# Patient Record
Sex: Male | Born: 2000 | Race: Black or African American | Hispanic: No | Marital: Single | Smoking: Current every day smoker
Health system: Southern US, Community
[De-identification: ages and names within clinical notes are randomized; demographics above are authoritative.]

## PROBLEM LIST (undated history)

## (undated) DIAGNOSIS — J45909 Unspecified asthma, uncomplicated: Secondary | ICD-10-CM

## (undated) DIAGNOSIS — J302 Other seasonal allergic rhinitis: Secondary | ICD-10-CM

## (undated) DIAGNOSIS — G43909 Migraine, unspecified, not intractable, without status migrainosus: Secondary | ICD-10-CM

## (undated) DIAGNOSIS — A879 Viral meningitis, unspecified: Secondary | ICD-10-CM

## (undated) HISTORY — PX: TONSILLECTOMY: SUR1361

## (undated) HISTORY — PX: CIRCUMCISION: SUR203

---
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
## 2018-04-11 ENCOUNTER — Other Ambulatory Visit: Payer: Self-pay

## 2018-04-11 ENCOUNTER — Encounter (HOSPITAL_BASED_OUTPATIENT_CLINIC_OR_DEPARTMENT_OTHER): Payer: Self-pay | Admitting: *Deleted

## 2018-04-11 ENCOUNTER — Emergency Department (HOSPITAL_BASED_OUTPATIENT_CLINIC_OR_DEPARTMENT_OTHER)
Admission: EM | Admit: 2018-04-11 | Discharge: 2018-04-11 | Disposition: A | Discharge status: Home / Self Care | Payer: Medicaid Other | Attending: Emergency Medicine | Admitting: Emergency Medicine

## 2018-04-11 DIAGNOSIS — F17210 Nicotine dependence, cigarettes, uncomplicated: Secondary | ICD-10-CM | POA: Insufficient documentation

## 2018-04-11 DIAGNOSIS — G43909 Migraine, unspecified, not intractable, without status migrainosus: Secondary | ICD-10-CM | POA: Diagnosis present

## 2018-04-11 DIAGNOSIS — J45909 Unspecified asthma, uncomplicated: Secondary | ICD-10-CM | POA: Insufficient documentation

## 2018-04-11 DIAGNOSIS — Z79899 Other long term (current) drug therapy: Secondary | ICD-10-CM | POA: Diagnosis not present

## 2018-04-11 DIAGNOSIS — G43009 Migraine without aura, not intractable, without status migrainosus: Secondary | ICD-10-CM | POA: Diagnosis not present

## 2018-04-11 MED ORDER — METOCLOPRAMIDE HCL 5 MG/ML IJ SOLN
10.0000 mg | Freq: Once | INTRAMUSCULAR | Status: AC
  Administered 2018-04-11: 10 mg via INTRAVENOUS
  Filled 2018-04-11: qty 2

## 2018-04-11 NOTE — ED Triage Notes (Signed)
Pt reports his usual migraine pain radiating from right temple to left, with photophobia, states he went to work but could not finish out his shift due to the headache. Denies n/v/fevers or any other c/o.

## 2018-04-11 NOTE — Discharge Instructions (Addendum)
Return as needed or follow up   with your primary care physician

## 2018-04-11 NOTE — ED Provider Notes (Signed)
[facility] EMERGENCY DEPARTMENT Provider Note   CSN: [id number] Arrival date & time: 04/11/18  7829     History   Chief Complaint Chief Complaint  Patient presents with  . Migraine    HPI Rick Silva is a 17 y.o. male., complains of typical migraine headache started last night gradually. Headache is throbbing in nature accompanied by nausea and photophobia he took a medication which she does not recall,with transient relief last night. Headache was severe again upon awakening this morning.symptoms made worse by looking at light. Not improved by anything. No other associated symptoms  HPI  Past Medical History:  Diagnosis Date  . Asthma   . Meningitis, viral   . Migraine   . Seasonal allergies     Patient Active Problem List   Diagnosis Date Noted  . Vasovagal syncope 10/22/2014  . Tension headache 10/22/2014  . Anxiety state 10/22/2014    Past Surgical History:  Procedure Laterality Date  . ADENOIDECTOMY, TONSILLECTOMY AND MYRINGOTOMY WITH TUBE PLACEMENT Bilateral 2006  . CIRCUMCISION    . TONSILLECTOMY          Home Medications    Prior to Admission medications   Medication Sig Start Date End Date Taking? Authorizing Provider  albuterol (PROVENTIL HFA;VENTOLIN HFA) 108 (90 BASE) MCG/ACT inhaler Inhale 2 puffs into the lungs every 6 (six) hours as needed for wheezing or shortness of breath.    [provider]  albuterol (PROVENTIL) (2.5 MG/3ML) 0.083% nebulizer solution Take 2.5 mg by nebulization every 6 (six) hours as needed for wheezing or shortness of breath.    [provider]  budesonide (PULMICORT) 0.5 MG/2ML nebulizer solution Take 0.5 mg by nebulization 2 (two) times daily.    [provider]  cetirizine (ZYRTEC) 10 MG tablet Take 10 mg by mouth daily.    [provider]  ibuprofen (ADVIL,MOTRIN) 800 MG tablet Take 1 tablet (800 mg total) by mouth 3 (three) times daily. 05/10/14   Elson Areas, PA-C   ibuprofen (ADVIL,MOTRIN) 800 MG tablet Take 1 tablet (800 mg total) by mouth 3 (three) times daily. 06/14/16   Arby Barrette, MD  montelukast (SINGULAIR) 10 MG tablet Take 10 mg by mouth at bedtime.    [provider]  topiramate (TOPAMAX) 50 MG tablet Take 1 tablet (50 mg total) by mouth at bedtime. 06/04/15   Keturah Shavers, MD    Family History Family History  Problem Relation Age of Onset  . Migraines Mother   . Depression Mother   . Migraines Sister   . Bipolar disorder Sister   . Migraines Maternal Grandmother   . Anxiety disorder Maternal Grandmother   . Heart Problems Maternal Grandmother     Social History Social History   Tobacco Use  . Smoking status: Current Every Day Smoker  . Smokeless tobacco: Never Used  Substance Use Topics  . Alcohol use: No  . Drug use: No     Allergies   Other   Review of Systems Review of Systems  Constitutional: Negative.   HENT: Negative.   Eyes: Positive for photophobia.  Respiratory: Negative.   Cardiovascular: Negative.   Gastrointestinal: Positive for nausea.  Musculoskeletal: Negative.   Skin: Negative.   Neurological: Positive for headaches.  Psychiatric/Behavioral: Negative.   All other systems reviewed and are negative.    Physical Exam Updated Vital Signs BP 135/64 (BP Location: Right Arm)   Pulse (!) 58   Temp 98.3 F (36.8 C) (Oral)   Resp  18   Ht  (1.753 m)   Wt 113.4 kg (250 lb)   SpO2 100%   BMI 36.92 kg/m   Physical Exam  Constitutional: He is oriented to person, place, and time. He appears well-developed and well-nourished.  HENT:  Head: Normocephalic and atraumatic.  Eyes: Pupils are equal, round, and reactive to light. Conjunctivae are normal.  Neck: Neck supple. No tracheal deviation present. No thyromegaly present.  Cardiovascular: Normal rate and regular rhythm.  No murmur heard. Pulmonary/Chest: Effort normal and breath sounds normal.  Abdominal: Soft. Bowel sounds are  normal. He exhibits no distension. There is no tenderness.  Musculoskeletal: Normal range of motion. He exhibits no edema or tenderness.  Neurological: He is alert and oriented to person, place, and time. He displays normal reflexes. Coordination normal.  Gait normal Romberg normal pronator drift normal DTR symmetric bilaterally at knee jerk ankle jerk and biceps  Skin: Skin is warm and dry. No rash noted.  Psychiatric: He has a normal mood and affect.  Nursing note and vitals reviewed.    ED Treatments / Results  Labs (all labs ordered are listed, but only abnormal results are displayed) Labs Reviewed - No data to display  EKG None  Radiology No results found.  Procedures Procedures (including critical care time)  Medications Ordered in ED Medications - No data to display   Initial Impression / Assessment and Plan / ED Course  I have reviewed the triage vital signs and the nursing notes.  Pertinent labs & imaging results that were available during my care of the patient were reviewed by me and considered in my medical decision making (see chart for details). 1:05 PM patient feels much improved and ready to go home after treatment with intravenous Reglan    Plan follow-up with PMD as needed  Final Clinical Impressions(s) / ED Diagnoses   Final diagnoses:  None  diagnosis  Migraine headache  ED Discharge Orders    None       Doug Sou, MD 04/11/18 1635

## 2018-04-11 NOTE — ED Notes (Signed)
ED Provider at bedside. 

## 2020-04-11 ENCOUNTER — Emergency Department (HOSPITAL_BASED_OUTPATIENT_CLINIC_OR_DEPARTMENT_OTHER)
Admission: EM | Admit: 2020-04-11 | Discharge: 2020-04-11 | Disposition: A | Discharge status: Home / Self Care | Payer: BC Managed Care – PPO | Attending: Emergency Medicine | Admitting: Emergency Medicine

## 2020-04-11 ENCOUNTER — Encounter (HOSPITAL_BASED_OUTPATIENT_CLINIC_OR_DEPARTMENT_OTHER): Payer: Self-pay

## 2020-04-11 ENCOUNTER — Other Ambulatory Visit: Payer: Self-pay

## 2020-04-11 ENCOUNTER — Emergency Department (HOSPITAL_BASED_OUTPATIENT_CLINIC_OR_DEPARTMENT_OTHER): Payer: BC Managed Care – PPO

## 2020-04-11 DIAGNOSIS — R519 Headache, unspecified: Secondary | ICD-10-CM | POA: Diagnosis not present

## 2020-04-11 DIAGNOSIS — Y9289 Other specified places as the place of occurrence of the external cause: Secondary | ICD-10-CM | POA: Insufficient documentation

## 2020-04-11 DIAGNOSIS — Y9389 Activity, other specified: Secondary | ICD-10-CM | POA: Diagnosis not present

## 2020-04-11 DIAGNOSIS — L089 Local infection of the skin and subcutaneous tissue, unspecified: Secondary | ICD-10-CM

## 2020-04-11 DIAGNOSIS — J45909 Unspecified asthma, uncomplicated: Secondary | ICD-10-CM | POA: Insufficient documentation

## 2020-04-11 DIAGNOSIS — S0990XA Unspecified injury of head, initial encounter: Secondary | ICD-10-CM | POA: Diagnosis not present

## 2020-04-11 DIAGNOSIS — Z79899 Other long term (current) drug therapy: Secondary | ICD-10-CM | POA: Insufficient documentation

## 2020-04-11 DIAGNOSIS — Y999 Unspecified external cause status: Secondary | ICD-10-CM | POA: Diagnosis not present

## 2020-04-11 DIAGNOSIS — F17210 Nicotine dependence, cigarettes, uncomplicated: Secondary | ICD-10-CM | POA: Insufficient documentation

## 2020-04-11 DIAGNOSIS — S0101XA Laceration without foreign body of scalp, initial encounter: Secondary | ICD-10-CM | POA: Diagnosis not present

## 2020-04-11 MED ORDER — BACITRACIN ZINC 500 UNIT/GM EX OINT
TOPICAL_OINTMENT | Freq: Once | CUTANEOUS | Status: AC
Start: 2020-04-11 — End: 2020-04-11

## 2020-04-11 MED ORDER — CEPHALEXIN 500 MG PO CAPS: 500.0000 mg | ORAL_CAPSULE | Freq: Four times a day (QID) | ORAL | 0 refills | Status: AC
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
## 2020-04-11 NOTE — Discharge Instructions (Addendum)
Take the antibiotics for the wound infection.  Follow-up with your doctor as needed
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
